# Patient Record
Sex: Male | Born: 1935 | Race: White | Hispanic: No | Marital: Single | Smoking: Never smoker
Health system: Southern US, Community
[De-identification: ages and names within clinical notes are randomized; demographics above are authoritative.]

## PROBLEM LIST (undated history)

## (undated) DIAGNOSIS — K311 Adult hypertrophic pyloric stenosis: Secondary | ICD-10-CM

## (undated) DIAGNOSIS — E079 Disorder of thyroid, unspecified: Secondary | ICD-10-CM

## (undated) DIAGNOSIS — K2270 Barrett's esophagus without dysplasia: Secondary | ICD-10-CM

## (undated) DIAGNOSIS — K219 Gastro-esophageal reflux disease without esophagitis: Secondary | ICD-10-CM

## (undated) HISTORY — PX: APPENDECTOMY: SHX54

## (undated) HISTORY — PX: OTHER SURGICAL HISTORY: SHX169

## (undated) HISTORY — PX: THYROID SURGERY: SHX805

---

## 2011-07-16 ENCOUNTER — Emergency Department (HOSPITAL_BASED_OUTPATIENT_CLINIC_OR_DEPARTMENT_OTHER): Payer: Medicare Other

## 2011-07-16 ENCOUNTER — Emergency Department (HOSPITAL_BASED_OUTPATIENT_CLINIC_OR_DEPARTMENT_OTHER)
Admission: EM | Admit: 2011-07-16 | Discharge: 2011-07-16 | Disposition: A | Discharge status: Home / Self Care | Payer: Medicare Other | Attending: Emergency Medicine | Admitting: Emergency Medicine

## 2011-07-16 DIAGNOSIS — Z79899 Other long term (current) drug therapy: Secondary | ICD-10-CM | POA: Insufficient documentation

## 2011-07-16 DIAGNOSIS — R110 Nausea: Secondary | ICD-10-CM | POA: Insufficient documentation

## 2011-07-16 DIAGNOSIS — R300 Dysuria: Secondary | ICD-10-CM | POA: Insufficient documentation

## 2011-07-16 DIAGNOSIS — N138 Other obstructive and reflux uropathy: Secondary | ICD-10-CM | POA: Insufficient documentation

## 2011-07-16 DIAGNOSIS — N23 Unspecified renal colic: Secondary | ICD-10-CM

## 2011-07-16 DIAGNOSIS — N400 Enlarged prostate without lower urinary tract symptoms: Secondary | ICD-10-CM

## 2011-07-16 DIAGNOSIS — M549 Dorsalgia, unspecified: Secondary | ICD-10-CM | POA: Insufficient documentation

## 2011-07-16 DIAGNOSIS — R1084 Generalized abdominal pain: Secondary | ICD-10-CM | POA: Insufficient documentation

## 2011-07-16 DIAGNOSIS — N401 Enlarged prostate with lower urinary tract symptoms: Secondary | ICD-10-CM | POA: Insufficient documentation

## 2011-07-16 LAB — URINALYSIS, ROUTINE W REFLEX MICROSCOPIC
Bilirubin Urine: NEGATIVE
Ketones, ur: NEGATIVE mg/dL
Nitrite: NEGATIVE
Urobilinogen, UA: 0.2 mg/dL (ref 0.0–1.0)

## 2011-07-16 LAB — CBC WITH DIFFERENTIAL/PLATELET
Basophils Relative: 0 % (ref 0–1)
Eosinophils Absolute: 0 10*3/uL (ref 0.0–0.7)
Eosinophils Relative: 0 % (ref 0–5)
MCH: 31.5 pg (ref 26.0–34.0)
MCHC: 36.3 g/dL — ABNORMAL HIGH (ref 30.0–36.0)
MCV: 86.9 fL (ref 78.0–100.0)
Monocytes Relative: 6 % (ref 3–12)
Neutrophils Relative %: 80 % — ABNORMAL HIGH (ref 43–77)
Platelets: 278 10*3/uL (ref 150–400)

## 2011-07-16 LAB — URINE MICROSCOPIC-ADD ON

## 2011-07-16 LAB — BASIC METABOLIC PANEL
BUN: 20 mg/dL (ref 6–23)
Calcium: 8.9 mg/dL (ref 8.4–10.5)
GFR calc Af Amer: 63 mL/min — ABNORMAL LOW (ref >90)
GFR calc non Af Amer: 54 mL/min — ABNORMAL LOW (ref >90)
Potassium: 4 mEq/L (ref 3.5–5.1)
Sodium: 137 mEq/L (ref 135–145)

## 2011-07-16 MED ORDER — OXYCODONE-ACETAMINOPHEN 5-325 MG PO TABS
1.0000 | ORAL_TABLET | Freq: Once | ORAL | Status: AC
  Administered 2011-07-16: 1 via ORAL
  Filled 2011-07-16: qty 1

## 2011-07-16 MED ORDER — ONDANSETRON HCL 4 MG/2ML IJ SOLN
4.0000 mg | Freq: Once | INTRAMUSCULAR | Status: AC
  Administered 2011-07-16: 4 mg via INTRAVENOUS
  Filled 2011-07-16: qty 2

## 2011-07-16 MED ORDER — HYDROCODONE-ACETAMINOPHEN 5-325 MG PO TABS: 1.0000 | ORAL_TABLET | Freq: Three times a day (TID) | ORAL | Status: AC | PRN

## 2011-07-16 MED ORDER — FENTANYL CITRATE 0.05 MG/ML IJ SOLN
50.0000 ug | Freq: Once | INTRAMUSCULAR | Status: AC
  Administered 2011-07-16: 50 ug via INTRAVENOUS
  Filled 2011-07-16: qty 2

## 2011-07-16 MED ORDER — HYDROCODONE-ACETAMINOPHEN 5-325 MG PO TABS
1.0000 | ORAL_TABLET | Freq: Once | ORAL | Status: AC
  Administered 2011-07-16: 1 via ORAL
  Filled 2011-07-16: qty 1

## 2011-07-16 MED ORDER — IBUPROFEN 400 MG PO TABS: 400.0000 mg | ORAL_TABLET | Freq: Three times a day (TID) | ORAL | Status: AC | PRN

## 2011-07-16 NOTE — ED Notes (Signed)
Pt amb to room 2 with quick steady gait in nad. Pt reports sudden onset of llq pain radiating into his penis and to his left flank area approx 2 hours ago. Pt reports history of renal stones, pt reports some hematuria two days ago.

## 2011-07-16 NOTE — ED Provider Notes (Signed)
History     CSN: [id number]  Arrival date & time 07/16/11  4098   First MD Initiated Contact with Patient 07/16/11 1000      Chief Complaint  Patient presents with  . Abdominal Pain     Patient is a 78 y.o. male presenting with abdominal pain. The history is provided by the patient and a relative.  Abdominal Pain The primary symptoms of the illness include abdominal pain, nausea and dysuria. The primary symptoms of the illness do not include fever, shortness of breath, vomiting or diarrhea. The current episode started 3 to 5 hours ago. The onset of the illness was sudden. The problem has been gradually worsening.  Additional symptoms associated with the illness include back pain. Symptoms associated with the illness do not include constipation.  pt reports onset of left flank pain that radiates to LLQ and left groin Reports distant h/o kidney stones   PMH - barrett's esophagus   No past surgical history on file.  No family history on file.  History  Substance Use Topics  . Smoking status: Not on file  . Smokeless tobacco: Not on file  . Alcohol Use: Not on file  pt denies any drug use    Review of Systems  Constitutional: Negative for fever.  Respiratory: Negative for shortness of breath.   Gastrointestinal: Positive for nausea and abdominal pain. Negative for vomiting, diarrhea and constipation.  Genitourinary: Positive for dysuria.  Musculoskeletal: Positive for back pain.  All other systems reviewed and are negative.    Allergies  Review of patient's allergies indicates no known allergies.  Home Medications   Current Outpatient Rx  Name Route Sig Dispense Refill  . FLUTICASONE FUROATE 27.5 MCG/SPRAY NA SUSP Nasal Place 2 sprays into the nose daily.    Marland Kitchen LEVOTHYROXINE SODIUM 100 MCG PO TABS Oral Take 100 mcg by mouth daily.    Marland Kitchen PANTOPRAZOLE SODIUM 40 MG PO TBEC Oral Take 40 mg by mouth daily.    Marland Kitchen ZOLPIDEM TARTRATE ER 12.5 MG PO TBCR Oral Take 12.5 mg by  mouth at bedtime as needed.      BP 173/76  Pulse 60  Temp 97.9 F (36.6 C) (Oral)  Resp 18  Ht 5\' 6"  (1.676 m)  Wt 146 lb (66.225 kg)  BMI 23.56 kg/m2  SpO2 98%  Physical Exam CONSTITUTIONAL: Well developed/well nourished HEAD AND FACE: Normocephalic/atraumatic EYES: EOMI/PERRL ENMT: Mucous membranes moist NECK: supple no meningeal signs SPINE:entire spine nontender CV: S1/S2 noted, no murmurs/rubs/gallops noted LUNGS: Lungs are clear to auscultation bilaterally, no apparent distress ABDOMEN: soft, nontender, no rebound or guarding JX:BJYN cva tenderness, no hernia, no testicular tenderness, chaperone present NEURO: Pt is awake/alert, moves all extremitiesx4, no focal leg weakness noted EXTREMITIES: pulses normal, full ROM SKIN: warm, color normal PSYCH: no abnormalities of mood noted  ED Course  Procedures  Labs Reviewed  CBC WITH DIFFERENTIAL - Abnormal; Notable for the following:    MCHC 36.3 (*)     Neutrophils Relative 80 (*)     All other components within normal limits  BASIC METABOLIC PANEL - Abnormal; Notable for the following:    Glucose, Bld 159 (*)     GFR calc non Af Amer 54 (*)     GFR calc Af Amer 63 (*)     All other components within normal limits  URINALYSIS, ROUTINE W REFLEX MICROSCOPIC   10:46 AM Pt reports distant h/o "gravel" in his kidney, at this time history c/w ureteral colic but  given age, will obtain CT imaging   2:20 PM Pt with some improvement in pain Reports his urologist is in [place].  He request ct imaging to take to him.  Advised of enlarged prostate on CT scan  3:17 PM Pt ambulatory, feels improved, feels safe for d/c.  Did not tolerate percocet, he was able tolerate small dose of vicodin, gave vicodin at d/c and also short course of ibuprofen.   MDM  Nursing notes including past medical history and social history reviewed and considered in documentation All labs/vitals reviewed and considered         Joya Gaskins, MD 07/16/11 [phone]

## 2011-07-16 NOTE — ED Notes (Signed)
Pt ambulatory with standby assistance 

## 2012-06-01 ENCOUNTER — Encounter (HOSPITAL_BASED_OUTPATIENT_CLINIC_OR_DEPARTMENT_OTHER): Payer: Self-pay

## 2012-06-01 ENCOUNTER — Emergency Department (HOSPITAL_BASED_OUTPATIENT_CLINIC_OR_DEPARTMENT_OTHER)
Admission: EM | Admit: 2012-06-01 | Discharge: 2012-06-01 | Disposition: A | Discharge status: Home / Self Care | Payer: Medicare Other | Attending: Emergency Medicine | Admitting: Emergency Medicine

## 2012-06-01 DIAGNOSIS — L509 Urticaria, unspecified: Secondary | ICD-10-CM | POA: Diagnosis present

## 2012-06-01 DIAGNOSIS — K2270 Barrett's esophagus without dysplasia: Secondary | ICD-10-CM | POA: Insufficient documentation

## 2012-06-01 DIAGNOSIS — K219 Gastro-esophageal reflux disease without esophagitis: Secondary | ICD-10-CM | POA: Insufficient documentation

## 2012-06-01 DIAGNOSIS — J309 Allergic rhinitis, unspecified: Secondary | ICD-10-CM | POA: Insufficient documentation

## 2012-06-01 DIAGNOSIS — Z8719 Personal history of other diseases of the digestive system: Secondary | ICD-10-CM | POA: Insufficient documentation

## 2012-06-01 DIAGNOSIS — R21 Rash and other nonspecific skin eruption: Secondary | ICD-10-CM | POA: Insufficient documentation

## 2012-06-01 DIAGNOSIS — R1310 Dysphagia, unspecified: Secondary | ICD-10-CM | POA: Insufficient documentation

## 2012-06-01 DIAGNOSIS — E079 Disorder of thyroid, unspecified: Secondary | ICD-10-CM | POA: Insufficient documentation

## 2012-06-01 DIAGNOSIS — Z79899 Other long term (current) drug therapy: Secondary | ICD-10-CM | POA: Insufficient documentation

## 2012-06-01 DIAGNOSIS — L299 Pruritus, unspecified: Secondary | ICD-10-CM | POA: Insufficient documentation

## 2012-06-01 HISTORY — DX: Disorder of thyroid, unspecified: E07.9

## 2012-06-01 HISTORY — DX: Gastro-esophageal reflux disease without esophagitis: K21.9

## 2012-06-01 HISTORY — DX: Barrett's esophagus without dysplasia: K22.70

## 2012-06-01 HISTORY — DX: Adult hypertrophic pyloric stenosis: K31.1

## 2012-06-01 MED ORDER — ESOMEPRAZOLE MAGNESIUM 40 MG PO CPDR: 40.0000 mg | DELAYED_RELEASE_CAPSULE | Freq: Every day | ORAL | Status: DC

## 2012-06-01 MED ORDER — LORATADINE 10 MG PO TABS: 10.0000 mg | ORAL_TABLET | Freq: Every day | ORAL | Status: DC

## 2012-06-01 MED ORDER — LORATADINE 10 MG PO TABS
ORAL_TABLET | ORAL | Status: AC
  Filled 2012-06-01: qty 1

## 2012-06-01 MED ORDER — LORATADINE 10 MG PO TABS
10.0000 mg | ORAL_TABLET | Freq: Every day | ORAL | Status: DC
  Administered 2012-06-01: 08:00:00 via ORAL

## 2012-06-01 MED ORDER — FAMOTIDINE 40 MG PO TABS: 40.0000 mg | ORAL_TABLET | Freq: Two times a day (BID) | ORAL | Status: DC

## 2012-06-01 MED ORDER — FAMOTIDINE 20 MG PO TABS
40.0000 mg | ORAL_TABLET | Freq: Once | ORAL | Status: AC
  Administered 2012-06-01: 40 mg via ORAL
  Filled 2012-06-01 (×2): qty 1

## 2012-06-01 NOTE — ED Provider Notes (Signed)
History    CSN: [id number] Arrival date & time 06/01/12  0712  None    Chief Complaint  Patient presents with  . Urticaria   Patient is a 79 y.o. male presenting with allergic reaction. The history is provided by the patient.  Allergic Reaction Presenting symptoms: difficulty swallowing (Chronic and is unchanged.  Reports esophageal stricture), itching and rash   Presenting symptoms: no difficulty breathing, no swelling and no wheezing   Itching:    Location:  Full body   Severity:  Moderate   Onset quality:  Gradual   Duration:  1 day   Timing:  Constant   Progression:  Worsening Severity:  Severe Prior allergic episodes:  No prior episodes Context: medications (just pickup a refill on omeprazole.  2 were taken yesterday and reaction occur approximately our after taken second pill)   Context: no animal exposure, no chemicals, no cosmetics, no new detergents/soaps and no poison ivy   Relieved by:  None tried Worsened by:  Nothing tried Ineffective treatments:  None tried  Past Medical History  Diagnosis Date  . Thyroid disease   . Barretts esophagus   . GERD (gastroesophageal reflux disease)   . Pyloric stenosis     Past Surgical History  Procedure Laterality Date  . Thyroid surgery    . Appendectomy      Ruptured, opened  . Pyloric stenosis surgery      At age 66 months    No family history on file.  History  Substance Use Topics  . Smoking status: Never Smoker   . Smokeless tobacco: Not on file  . Alcohol Use: No    Review of Systems  Constitutional: Negative for fever, chills, diaphoresis, activity change, appetite change and fatigue.  HENT: Positive for trouble swallowing (Chronic and is unchanged.  Reports esophageal stricture). Negative for congestion, facial swelling, rhinorrhea, neck pain and voice change.   Eyes: Negative.   Respiratory: Negative for cough, chest tightness, shortness of breath and wheezing.   Cardiovascular: Negative for chest pain  and palpitations.  Genitourinary: Negative for difficulty urinating.  Skin: Positive for itching and rash.  Allergic/Immunologic: Positive for environmental allergies and food allergies (No new food exposure). Negative for immunocompromised state.  Neurological: Negative.  Negative for dizziness and headaches.  Hematological: Negative.  Negative for adenopathy.  Psychiatric/Behavioral: Negative.     Allergies  Shellfish allergy  Home Medications   Current Outpatient Rx  Name  Route  Sig  Dispense  Refill  . esomeprazole (NEXIUM) 40 MG capsule   Oral   Take 1 capsule (40 mg total) by mouth daily before breakfast.   30 capsule   0   . famotidine (PEPCID) 40 MG tablet   Oral   Take 1 tablet (40 mg total) by mouth 2 (two) times daily.   14 tablet   1   . fluticasone (VERAMYST) 27.5 MCG/SPRAY nasal spray   Nasal   Place 2 sprays into the nose daily.         Marland Kitchen levothyroxine (SYNTHROID, LEVOTHROID) 100 MCG tablet   Oral   Take 100 mcg by mouth daily.         Marland Kitchen loratadine (CLARITIN) 10 MG tablet   Oral   Take 1 tablet (10 mg total) by mouth daily.   30 tablet   0   . zolpidem (AMBIEN CR) 12.5 MG CR tablet   Oral   Take 12.5 mg by mouth at bedtime as needed.  BP 131/84  Pulse 78  Temp(Src) 97.8 F (36.6 C) (Oral)  Resp 18  Ht 5' 6.5" (1.689 m)  Wt 146 lb (66.225 kg)  BMI 23.21 kg/m2  SpO2 96%  Physical Exam  Nursing note and vitals reviewed. Constitutional: He appears well-developed and well-nourished. No distress.  HENT:  Head: Normocephalic and atraumatic.  Mouth/Throat: No oral lesions. No edematous. No oropharyngeal exudate, posterior oropharyngeal edema or posterior oropharyngeal erythema.  Eyes: Conjunctivae are normal. Right eye exhibits no discharge. Left eye exhibits no discharge. No scleral icterus.  Neck: No JVD present. No tracheal tenderness present. Carotid bruit is not present. No rigidity. No tracheal deviation and no edema present.   Cardiovascular: Normal rate, regular rhythm, normal heart sounds and intact distal pulses.  Exam reveals no gallop and no friction rub.   No murmur heard. Pulmonary/Chest: Effort normal and breath sounds normal. No respiratory distress. He has no wheezes. He has no rales.  Abdominal: Soft.  Musculoskeletal: Normal range of motion. He exhibits no edema.  Neurological: He is alert. He exhibits normal muscle tone.  Skin: Skin is warm and dry. Rash noted. Rash is urticarial. He is not diaphoretic. No erythema. No pallor.     Psychiatric: He has a normal mood and affect. His behavior is normal. Judgment and thought content normal.    ED Course  Procedures (including critical care time)  Labs Reviewed - No data to display No results found.   1. Urticaria     MDM  Patient with classic urticarial reaction.  No evidence of airway involvement.  Unclear if idiopathic versus reaction to omeprazole that he received a new refill on yesterday.  Given his age and [place] list consideration will treat with Zyrtec and Pepcid avoiding 1st gen H1 blockers.    New prescription for esomeprazole.  Recognize Pepcid and esomeprazole as duplicate therapy; the Pepcid will be short-term only to help with reaction.   Andrena Mews, DO 06/01/12 [phone]

## 2012-06-01 NOTE — ED Provider Notes (Signed)
I saw and evaluated the patient, reviewed the resident's note and I agree with the findings and plan.  On my exam this pleasant male was in no distress.  No e/o respiratory distress. We discussed possible precipitants and return precautions.  He was d/c in stable condition.  Gerhard Munch, MD 06/01/12 [phone]

## 2012-06-01 NOTE — ED Notes (Signed)
MD at bedside. 

## 2012-06-01 NOTE — ED Notes (Addendum)
Pt reports hives on arms and legs that started yesterday after taking a second dose of Protonix.

## 2013-07-28 ENCOUNTER — Encounter (HOSPITAL_BASED_OUTPATIENT_CLINIC_OR_DEPARTMENT_OTHER): Payer: Self-pay | Admitting: Emergency Medicine

## 2013-07-28 ENCOUNTER — Emergency Department (HOSPITAL_BASED_OUTPATIENT_CLINIC_OR_DEPARTMENT_OTHER)
Admission: EM | Admit: 2013-07-28 | Discharge: 2013-07-28 | Disposition: A | Discharge status: Home / Self Care | Payer: Medicare Other | Attending: Emergency Medicine | Admitting: Emergency Medicine

## 2013-07-28 DIAGNOSIS — E079 Disorder of thyroid, unspecified: Secondary | ICD-10-CM | POA: Diagnosis not present

## 2013-07-28 DIAGNOSIS — R21 Rash and other nonspecific skin eruption: Secondary | ICD-10-CM | POA: Insufficient documentation

## 2013-07-28 DIAGNOSIS — K219 Gastro-esophageal reflux disease without esophagitis: Secondary | ICD-10-CM | POA: Insufficient documentation

## 2013-07-28 DIAGNOSIS — IMO0002 Reserved for concepts with insufficient information to code with codable children: Secondary | ICD-10-CM | POA: Diagnosis not present

## 2013-07-28 DIAGNOSIS — Z79899 Other long term (current) drug therapy: Secondary | ICD-10-CM | POA: Diagnosis not present

## 2013-07-28 MED ORDER — PREDNISONE 20 MG PO TABS: ORAL_TABLET | ORAL | Status: DC

## 2013-07-28 NOTE — ED Provider Notes (Signed)
CSN: [id number]     Arrival date & time 07/28/13  [id number] History   First MD Initiated Contact with Patient 07/28/13 [phone]     Chief Complaint  Patient presents with  . Allergic Reaction     (Consider location/radiation/quality/duration/timing/severity/associated sxs/prior Treatment) HPI Comments: 80 year old male with reflux and thyroid disease presents with intermittent gradually worsening rash in the axilla and groin bilateral, pruritic.  Patient had similar episode in April that went away he is unsure what medicine he was given. Patient not aware of any new medicines, soaps, detergents or other new exposures, no hiking recently. He lives in retirement living and unsure of any new cleaners use of the facility. No fevers chills or systemic symptoms.  Patient is a 80 y.o. male presenting with allergic reaction. The history is provided by the patient.  Allergic Reaction Presenting symptoms: rash     Past Medical History  Diagnosis Date  . Thyroid disease   . Barretts esophagus   . GERD (gastroesophageal reflux disease)   . Pyloric stenosis    Past Surgical History  Procedure Laterality Date  . Thyroid surgery    . Appendectomy      Ruptured, opened  . Pyloric stenosis surgery      At age 653 months   No family history on file. History  Substance Use Topics  . Smoking status: Never Smoker   . Smokeless tobacco: Not on file  . Alcohol Use: No    Review of Systems  Constitutional: Negative for fever and chills.  Respiratory: Negative for shortness of breath.   Skin: Positive for rash.  Neurological: Negative for light-headedness.      Allergies  Shellfish allergy  Home Medications   Prior to Admission medications   Medication Sig Start Date End Date Taking? Authorizing Provider  esomeprazole (NEXIUM) 40 MG capsule Take 1 capsule (40 mg total) by mouth daily before breakfast. 06/01/12   Andrena MewsMichael D Rigby, DO  famotidine (PEPCID) 40 MG tablet Take 1 tablet (40 mg total) by  mouth 2 (two) times daily. 06/01/12   Andrena MewsMichael D Rigby, DO  fluticasone (VERAMYST) 27.5 MCG/SPRAY nasal spray Place 2 sprays into the nose daily.    Historical Provider, MD  levothyroxine (SYNTHROID, LEVOTHROID) 100 MCG tablet Take 100 mcg by mouth daily.    Historical Provider, MD  loratadine (CLARITIN) 10 MG tablet Take 1 tablet (10 mg total) by mouth daily. 06/01/12   Andrena MewsMichael D Rigby, DO  zolpidem (AMBIEN CR) 12.5 MG CR tablet Take 12.5 mg by mouth at bedtime as needed.    Historical Provider, MD   BP 148/69  Pulse 83  Temp(Src) 98 F (36.7 C) (Oral)  Resp 14  Ht 5' 6.5" (1.689 m)  Wt 148 lb (67.132 kg)  BMI 23.53 kg/m2  SpO2 100% Physical Exam  Nursing note and vitals reviewed. Constitutional: He appears well-developed and well-nourished. No distress.  HENT:  Head: Normocephalic and atraumatic.  Eyes: Right eye exhibits no discharge. Left eye exhibits no discharge.  Cardiovascular: Normal rate.   Pulmonary/Chest: Effort normal. No respiratory distress. He has no wheezes.  Neurological: He is alert.  Skin: Rash noted.  Patient has scattered hives and upper flank/axilla bilateral and lateral groin line without discharge or sign of infection.    ED Course  Procedures (including critical care time) Labs Review Labs Reviewed - No data to display  Imaging Review No results found.   EKG Interpretation None      MDM   Final diagnoses:  Rash and nonspecific skin eruption   With location of rash discussed possibility of related to detergent as increase contact with close in that area. Patient will try to switch detergent to something more pure/nonscented etc. if no improvement he will try a short course of prednisone followup with his doctor.  Results and differential diagnosis were discussed with the patient/parent/guardian. Close follow up outpatient was discussed, comfortable with the plan.   Medications - No data to display  Filed Vitals:   07/28/13 0724  BP: 148/69   Pulse: 83  Temp: 98 F (36.7 C)  TempSrc: Oral  Resp: 14  Height: 5' 6.5" (1.689 m)  Weight: 148 lb (67.132 kg)  SpO2: 100%        Enid Skeens, MD 07/28/13 [phone]

## 2013-07-28 NOTE — Discharge Instructions (Signed)
Try to switch her detergents and soaps in your home. This does not help or your symptoms worsen try short course of prednisone and follow up at your primary care Office or dermatology. Return to the ER if you develop breathing difficulty, tongues swelling, throat swelling or new or worsening concerns.  If you were given medicines take as directed.  If you are on coumadin or contraceptives realize their levels and effectiveness is altered by many different medicines.  If you have any reaction (rash, tongues swelling, other) to the medicines stop taking and see a physician.   Please follow up as directed and return to the ER or see a physician for new or worsening symptoms.  Thank you. Filed Vitals:   07/28/13 0724  BP: 148/69  Pulse: 83  Temp: 98 F (36.7 C)  TempSrc: Oral  Resp: 14  Height: 5' 6.5" (1.689 m)  Weight: 148 lb (67.132 kg)  SpO2: 100%

## 2013-07-28 NOTE — ED Notes (Signed)
Onset of sx  Tuesday. Whelps over arms, chest, and back scattered. C/p itching. States it happened in April and went away. No changes is soap and detergents. No resp issues.

## 2014-06-18 ENCOUNTER — Emergency Department (HOSPITAL_BASED_OUTPATIENT_CLINIC_OR_DEPARTMENT_OTHER)
Admission: EM | Admit: 2014-06-18 | Discharge: 2014-06-18 | Disposition: A | Discharge status: Home / Self Care | Payer: Medicare Other | Attending: Emergency Medicine | Admitting: Emergency Medicine

## 2014-06-18 DIAGNOSIS — J019 Acute sinusitis, unspecified: Secondary | ICD-10-CM | POA: Diagnosis not present

## 2014-06-18 DIAGNOSIS — Z79899 Other long term (current) drug therapy: Secondary | ICD-10-CM | POA: Insufficient documentation

## 2014-06-18 DIAGNOSIS — Z8719 Personal history of other diseases of the digestive system: Secondary | ICD-10-CM | POA: Insufficient documentation

## 2014-06-18 DIAGNOSIS — E039 Hypothyroidism, unspecified: Secondary | ICD-10-CM | POA: Insufficient documentation

## 2014-06-18 DIAGNOSIS — R51 Headache: Secondary | ICD-10-CM | POA: Diagnosis present

## 2014-06-18 MED ORDER — AMOXICILLIN-POT CLAVULANATE 875-125 MG PO TABS: 1.0000 | ORAL_TABLET | Freq: Two times a day (BID) | ORAL | Status: DC

## 2014-06-18 MED ORDER — LORATADINE 10 MG PO TABS: 10.0000 mg | ORAL_TABLET | Freq: Every day | ORAL | Status: DC

## 2014-06-18 MED ORDER — SALINE SPRAY 0.65 % NA SOLN: 1.0000 | NASAL | Status: AC | PRN

## 2014-06-18 NOTE — ED Provider Notes (Signed)
CSN: [id number]     Arrival date & time 06/18/14  0950 History   First MD Initiated Contact with Patient 06/18/14 1000     Chief Complaint  Patient presents with  . Head congestion      (Consider location/radiation/quality/duration/timing/severity/associated sxs/prior Treatment) HPI Comments: Patient is a 81 year old male past medical history significant for thyroid disease, Barrett's esophagus, GERD presenting to the ED for one month of sinus pressure and congestion that has been worsening. He states it feels very full behind his eyes and he has tried nasal spray with little to no improvement over the last 5 days. He does endorse one episode of feeling lightheaded 5 days ago, states he did not drink as much water that day as he should've. Denies any further recurrence. No modifying factors identified. Denies any headache, shortness of breath, chest pain, syncope, nausea, vomiting, abdominal pain.   Past Medical History  Diagnosis Date  . Thyroid disease   . Barretts esophagus   . GERD (gastroesophageal reflux disease)   . Pyloric stenosis    Past Surgical History  Procedure Laterality Date  . Thyroid surgery    . Appendectomy      Ruptured, opened  . Pyloric stenosis surgery      At age 30 months   No family history on file. History  Substance Use Topics  . Smoking status: Never Smoker   . Smokeless tobacco: Not on file  . Alcohol Use: No    Review of Systems  HENT: Positive for congestion and sinus pressure. Negative for facial swelling and nosebleeds.   All other systems reviewed and are negative.     Allergies  Shellfish allergy  Home Medications   Prior to Admission medications   Medication Sig Start Date End Date Taking? Authorizing Provider  amoxicillin-clavulanate (AUGMENTIN) 875-125 MG per tablet Take 1 tablet by mouth every 12 (twelve) hours. 06/18/14   Jozy Mcphearson, PA-C  famotidine (PEPCID) 40 MG tablet Take 1 tablet (40 mg total) by mouth 2 (two)  times daily. 06/01/12   Andrena Mews, DO  lanolin ointment Apply topically as needed for dry skin.    Historical Provider, MD  levothyroxine (SYNTHROID, LEVOTHROID) 100 MCG tablet Take 100 mcg by mouth daily.    Historical Provider, MD  loratadine (CLARITIN) 10 MG tablet Take 1 tablet (10 mg total) by mouth daily. 06/18/14   Amaia Lavallie, PA-C  sodium chloride (OCEAN) 0.65 % SOLN nasal spray Place 1 spray into both nostrils as needed for congestion. 06/18/14   Virga Haltiwanger, PA-C  timolol (TIMOPTIC-XR) 0.5 % ophthalmic gel-forming 1 drop daily.    Historical Provider, MD  zolpidem (AMBIEN CR) 12.5 MG CR tablet Take 12.5 mg by mouth at bedtime as needed.    Historical Provider, MD   BP 127/76 mmHg  Pulse 84  Temp(Src) 97.8 F (36.6 C) (Oral)  Resp 18  Ht 5' 6.5" (1.689 m)  Wt 149 lb (67.586 kg)  BMI 23.69 kg/m2  SpO2 98% Physical Exam  Constitutional: He is oriented to person, place, and time. He appears well-developed and well-nourished. No distress.  HENT:  Head: Normocephalic and atraumatic.  Right Ear: Hearing, tympanic membrane, external ear and ear canal normal.  Left Ear: Hearing, tympanic membrane, external ear and ear canal normal.  Nose: Right sinus exhibits maxillary sinus tenderness and frontal sinus tenderness. Left sinus exhibits maxillary sinus tenderness and frontal sinus tenderness.  Mouth/Throat: Uvula is midline, oropharynx is clear and moist and mucous membranes are normal. No  oropharyngeal exudate.  Eyes: Conjunctivae and EOM are normal. Pupils are equal, round, and reactive to light.  Neck: Normal range of motion. Neck supple.  No nuchal rigidity.   Cardiovascular: Normal rate, regular rhythm, normal heart sounds and intact distal pulses.   Pulmonary/Chest: Effort normal and breath sounds normal. No respiratory distress.  Abdominal: Soft. There is no tenderness.  Musculoskeletal: Normal range of motion.  Neurological: He is alert and oriented to  person, place, and time. He has normal strength. No cranial nerve deficit. He displays a negative Romberg sign. Gait normal. GCS eye subscore is 4. GCS verbal subscore is 5. GCS motor subscore is 6.  Sensation grossly intact.  No pronator drift.  Bilateral heel-knee-shin intact.  Skin: Skin is warm and dry. He is not diaphoretic.  Psychiatric: He has a normal mood and affect.  Nursing note and vitals reviewed.   ED Course  Procedures (including critical care time) Medications - No data to display  Labs Review Labs Reviewed - No data to display  Imaging Review No results found.   EKG Interpretation None      MDM   Final diagnoses:  Acute sinusitis, recurrence not specified, unspecified location    Filed Vitals:   06/18/14 0959  BP: 127/76  Pulse: 84  Temp: 97.8 F (36.6 C)  Resp: 18   Afebrile, NAD, non-toxic appearing, AAOx4. No neurofocal deficits. No red flags on history or physical. Patient complaining of symptoms of sinusitis.    Severe symptoms have been present for greater than 10 days with purulent nasal discharge and maxillary sinus pain.  Concern for acute bacterial rhinosinusitis.  Patient discharged with Augmentin.  Instructions given for warm saline nasal wash and recommendations for follow-up with primary care physician.  Patient is stable at time of discharge. Patient d/w with Dr. Micheline Mazeocherty, agrees with plan.      Francee PiccoloJennifer Emerald Shor, PA-C 06/18/14 1046  Toy CookeyMegan Docherty, MD 06/18/14 [phone]

## 2014-06-18 NOTE — ED Notes (Signed)
Dizziness that started 5 days ago and lasted only that one episode.  Also has head congestion x 1 month.  He tells me he hasn't been drinking as much as he should and may be dehydrated.

## 2014-06-18 NOTE — Discharge Instructions (Signed)
Please follow up with your primary care physician in 1-2 days. If you do not have one please call the [facility] number listed above. Please take your antibiotic until completion. Please read all discharge instructions and return precautions.  ° ° °Sinusitis °Sinusitis is redness, soreness, and inflammation of the paranasal sinuses. Paranasal sinuses are air pockets within the bones of your face (beneath the eyes, the middle of the forehead, or above the eyes). In healthy paranasal sinuses, mucus is able to drain out, and air is able to circulate through them by way of your nose. However, when your paranasal sinuses are inflamed, mucus and air can become trapped. This can allow bacteria and other germs to grow and cause infection. °Sinusitis can develop quickly and last only a short time (acute) or continue over a long period (chronic). Sinusitis that lasts for more than 12 weeks is considered chronic.  °CAUSES  °Causes of sinusitis include: °· Allergies. °· Structural abnormalities, such as displacement of the cartilage that separates your nostrils (deviated septum), which can decrease the air flow through your nose and sinuses and affect sinus drainage. °· Functional abnormalities, such as when the small hairs (cilia) that line your sinuses and help remove mucus do not work properly or are not present. °SIGNS AND SYMPTOMS  °Symptoms of acute and chronic sinusitis are the same. The primary symptoms are pain and pressure around the affected sinuses. Other symptoms include: °· Upper toothache. °· Earache. °· Headache. °· Bad breath. °· Decreased sense of smell and taste. °· A cough, which worsens when you are lying flat. °· Fatigue. °· Fever. °· Thick drainage from your nose, which often is green and may contain pus (purulent). °· Swelling and warmth over the affected sinuses. °DIAGNOSIS  °Your health care provider will perform a physical exam. During the exam, your health care provider  may: °· Look in your nose for signs of abnormal growths in your nostrils (nasal polyps). °· Tap over the affected sinus to check for signs of infection. °· View the inside of your sinuses (endoscopy) using an imaging device that has a light attached (endoscope). °If your health care provider suspects that you have chronic sinusitis, one or more of the following tests may be recommended: °· Allergy tests. °· Nasal culture. A sample of mucus is taken from your nose, sent to a lab, and screened for bacteria. °· Nasal cytology. A sample of mucus is taken from your nose and examined by your health care provider to determine if your sinusitis is related to an allergy. °TREATMENT  °Most cases of acute sinusitis are related to a viral infection and will resolve on their own within 10 days. Sometimes medicines are prescribed to help relieve symptoms (pain medicine, decongestants, nasal steroid sprays, or saline sprays).  °However, for sinusitis related to a bacterial infection, your health care provider will prescribe antibiotic medicines. These are medicines that will help kill the bacteria causing the infection.  °Rarely, sinusitis is caused by a fungal infection. In theses cases, your health care provider will prescribe antifungal medicine. °For some cases of chronic sinusitis, surgery is needed. Generally, these are cases in which sinusitis recurs more than 3 times per year, despite other treatments. °HOME CARE INSTRUCTIONS  °· Drink plenty of water. Water helps thin the mucus so your sinuses can drain more easily. °· Use a humidifier. °· Inhale steam 3 to 4 times a day (for example, sit in the bathroom with the shower running). °· Apply   a warm, moist washcloth to your face 3 to 4 times a day, or as directed by your health care provider. °· Use saline nasal sprays to help moisten and clean your sinuses. °· Take medicines only as directed by your health care provider. °· If you were prescribed either an antibiotic or  antifungal medicine, finish it all even if you start to feel better. °SEEK IMMEDIATE MEDICAL CARE IF: °· You have increasing pain or severe headaches. °· You have nausea, vomiting, or drowsiness. °· You have swelling around your face. °· You have vision problems. °· You have a stiff neck. °· You have difficulty breathing. °MAKE SURE YOU:  °· Understand these instructions. °· Will watch your condition. °· Will get help right away if you are not doing well or get worse. °Document Released: 12/29/2004 Document Revised: 05/15/2013 Document Reviewed: 01/13/2011 °[employer]® Patient Information ©2015 [employer]. This information is not intended to replace advice given to you by your health care provider. Make sure you discuss any questions you have with your health care provider. ° ° ° °

## 2014-11-13 ENCOUNTER — Encounter (HOSPITAL_BASED_OUTPATIENT_CLINIC_OR_DEPARTMENT_OTHER): Payer: Self-pay

## 2014-11-13 ENCOUNTER — Emergency Department (HOSPITAL_BASED_OUTPATIENT_CLINIC_OR_DEPARTMENT_OTHER)
Admission: EM | Admit: 2014-11-13 | Discharge: 2014-11-13 | Disposition: A | Discharge status: Home / Self Care | Payer: Medicare Other | Attending: Emergency Medicine | Admitting: Emergency Medicine

## 2014-11-13 DIAGNOSIS — M5442 Lumbago with sciatica, left side: Secondary | ICD-10-CM

## 2014-11-13 DIAGNOSIS — E079 Disorder of thyroid, unspecified: Secondary | ICD-10-CM | POA: Diagnosis not present

## 2014-11-13 DIAGNOSIS — Z8719 Personal history of other diseases of the digestive system: Secondary | ICD-10-CM | POA: Insufficient documentation

## 2014-11-13 DIAGNOSIS — Z79899 Other long term (current) drug therapy: Secondary | ICD-10-CM | POA: Insufficient documentation

## 2014-11-13 DIAGNOSIS — M549 Dorsalgia, unspecified: Secondary | ICD-10-CM | POA: Diagnosis present

## 2014-11-13 LAB — URINALYSIS, ROUTINE W REFLEX MICROSCOPIC
Bilirubin Urine: NEGATIVE
Glucose, UA: NEGATIVE mg/dL
Ketones, ur: NEGATIVE mg/dL
LEUKOCYTES UA: NEGATIVE
Nitrite: NEGATIVE
PH: 6.5 (ref 5.0–8.0)
Protein, ur: NEGATIVE mg/dL
SPECIFIC GRAVITY, URINE: 1.007 (ref 1.005–1.030)
UROBILINOGEN UA: 0.2 mg/dL (ref 0.0–1.0)

## 2014-11-13 LAB — URINE MICROSCOPIC-ADD ON

## 2014-11-13 NOTE — ED Notes (Signed)
[place]pain to left flank at that time-over the past 2 weeks pain to left buttock and lower back-pt NAD-steady gait

## 2014-11-13 NOTE — ED Provider Notes (Signed)
CSN: [id number]     Arrival date & time 11/13/14  1415 History   First MD Initiated Contact with Patient 11/13/14 1629     Chief Complaint  Patient presents with  . Back Pain     (Consider location/radiation/quality/duration/timing/severity/associated sxs/prior Treatment) Patient is a 81 y.o. male presenting with back pain.  Back Pain Location:  Sacro-iliac joint Quality:  Aching and stabbing Radiates to: L buttock, L hip. Pain severity:  Moderate Pain is:  Same all the time Onset quality:  Gradual Duration:  2 weeks Timing:  Constant Progression:  Waxing and waning Chronicity:  New Context: falling (2 months ago)   Relieved by:  Lying down Worsened by:  Movement and sitting Associated symptoms: no abdominal pain, no bladder incontinence, no fever, no numbness, no paresthesias, no perianal numbness and no tingling     Past Medical History  Diagnosis Date  . Thyroid disease   . Barretts esophagus   . GERD (gastroesophageal reflux disease)   . Pyloric stenosis    Past Surgical History  Procedure Laterality Date  . Thyroid surgery    . Appendectomy      Ruptured, opened  . Pyloric stenosis surgery      At age 33 months   No family history on file. Social History  Substance Use Topics  . Smoking status: Never Smoker   . Smokeless tobacco: None  . Alcohol Use: Yes     Comment: occ    Review of Systems  Constitutional: Negative for fever.  Gastrointestinal: Negative for abdominal pain.  Genitourinary: Negative for bladder incontinence.  Musculoskeletal: Positive for back pain.  Neurological: Negative for tingling, numbness and paresthesias.  All other systems reviewed and are negative.     Allergies  Shellfish allergy  Home Medications   Prior to Admission medications   Medication Sig Start Date End Date Taking? Authorizing Provider  FINASTERIDE PO Take by mouth.   Yes Historical Provider, MD  levothyroxine (SYNTHROID, LEVOTHROID) 100 MCG tablet Take  100 mcg by mouth daily.    Historical Provider, MD  sodium chloride (OCEAN) 0.65 % SOLN nasal spray Place 1 spray into both nostrils as needed for congestion. 06/18/14   Jennifer Piepenbrink, PA-C  timolol (TIMOPTIC-XR) 0.5 % ophthalmic gel-forming 1 drop daily.    Historical Provider, MD  zolpidem (AMBIEN CR) 12.5 MG CR tablet Take 12.5 mg by mouth at bedtime as needed.    Historical Provider, MD   BP 154/79 mmHg  Pulse 99  Temp(Src) 98.8 F (37.1 C) (Oral)  Resp 18  Ht  (1.676 m)  Wt 148 lb (67.132 kg)  BMI 23.90 kg/m2  SpO2 94% Physical Exam  Constitutional: He is oriented to person, place, and time. He appears well-developed and well-nourished.  HENT:  Head: Normocephalic and atraumatic.  Eyes: Conjunctivae and EOM are normal.  Neck: Normal range of motion. Neck supple.  Cardiovascular: Normal rate, regular rhythm and normal heart sounds.   Pulmonary/Chest: Effort normal and breath sounds normal. No respiratory distress.  Abdominal: He exhibits no distension. There is no tenderness. There is no rebound and no guarding.  Musculoskeletal: Normal range of motion.       Lumbar back: He exhibits tenderness. He exhibits no bony tenderness.  Straight leg negative, no neuro deficits of legs  Neurological: He is alert and oriented to person, place, and time.  Skin: Skin is warm and dry.  Vitals reviewed.   ED Course  Procedures (including critical care time) Labs Review Labs Reviewed  URINALYSIS, ROUTINE W REFLEX MICROSCOPIC (NOT AT [facility]) - Abnormal; Notable for the following:    Hgb urine dipstick TRACE (*)    All other components within normal limits  URINE MICROSCOPIC-ADD ON    Imaging Review No results found. I have personally reviewed and evaluated these images and lab results as part of my medical decision-making.   EKG Interpretation None     EMERGENCY DEPARTMENT ULTRASOUND  Study: Limited Retroperitoneal Ultrasound of the Abdominal Aorta.  INDICATIONS:Back  pain and Age>55 Multiple views of the abdominal aorta were obtained in real-time from the diaphragmatic hiatus to the aortic bifurcation in transverse planes with a multi-frequency probe. PERFORMED BY: Myself IMAGES ARCHIVED?: Yes FINDINGS: Maximum aortic dimensions are 1.96 cm LIMITATIONS:  Bowel gas INTERPRETATION:  No abdominal aortic aneurysm and Abdominal free fluid absent   CPT Code: 16109-6076775-26 (limited retroperitoneal)   MDM   Final diagnoses:  Left-sided low back pain with left-sided sciatica    81 y.o. male with pertinent PMH of barretts esophagus presents with mild back pain as above.  Initially fell 2 months ago, symptoms improved, then recurred 2 weeks ago.  Physical exam on arrival as above.  UA unremarkable with exception of trace hgb, no RBC on micro.  History and exam not consistent with nephro or pyelonephritis.  Likely sciatica.  DC home in stable condition  I have reviewed all laboratory and imaging studies if ordered as above  1. Left-sided low back pain with left-sided sciatica         Mirian MoMatthew Jo-Ann Johanning, MD 11/13/14 1821

## 2014-11-13 NOTE — Discharge Instructions (Signed)

## 2014-11-13 NOTE — ED Notes (Signed)
Nurse first-pt ambulated to reg desk with steady gait and NAD-angrily questioning wait time-advised ED extremely busy today and he would be seen asap-pt ambulated back to seat w/o difficulty

## 2014-11-13 NOTE — ED Notes (Signed)
MD at bedside. 

## 2015-11-27 ENCOUNTER — Encounter (HOSPITAL_BASED_OUTPATIENT_CLINIC_OR_DEPARTMENT_OTHER): Payer: Self-pay

## 2015-11-27 ENCOUNTER — Emergency Department (HOSPITAL_BASED_OUTPATIENT_CLINIC_OR_DEPARTMENT_OTHER)
Admission: EM | Admit: 2015-11-27 | Discharge: 2015-11-27 | Disposition: A | Discharge status: Home / Self Care | Payer: Medicare Other | Attending: Emergency Medicine | Admitting: Emergency Medicine

## 2015-11-27 DIAGNOSIS — T18108A Unspecified foreign body in esophagus causing other injury, initial encounter: Secondary | ICD-10-CM | POA: Diagnosis not present

## 2015-11-27 DIAGNOSIS — Y939 Activity, unspecified: Secondary | ICD-10-CM | POA: Diagnosis not present

## 2015-11-27 DIAGNOSIS — Y929 Unspecified place or not applicable: Secondary | ICD-10-CM | POA: Diagnosis not present

## 2015-11-27 DIAGNOSIS — T189XXA Foreign body of alimentary tract, part unspecified, initial encounter: Secondary | ICD-10-CM | POA: Diagnosis present

## 2015-11-27 DIAGNOSIS — X58XXXA Exposure to other specified factors, initial encounter: Secondary | ICD-10-CM | POA: Diagnosis not present

## 2015-11-27 DIAGNOSIS — Z79899 Other long term (current) drug therapy: Secondary | ICD-10-CM | POA: Diagnosis not present

## 2015-11-27 DIAGNOSIS — Y999 Unspecified external cause status: Secondary | ICD-10-CM | POA: Insufficient documentation

## 2015-11-27 NOTE — Discharge Instructions (Signed)
Call the GI doctor on call and see if you can see them in the office. They may want to do an endoscopy or a swallow study on you.

## 2015-11-27 NOTE — ED Triage Notes (Signed)
Pt states he feels MVI pill is stuck in throat-took pill approx 25 min PTA-NAD-steady gait

## 2015-11-27 NOTE — ED Provider Notes (Signed)
[facility] Provider Note   CSN: [id number] Arrival date & time: 11/27/15  1244     History   Chief Complaint Chief Complaint  Patient presents with  . Swallowed Foreign Body    HPI Steven Warner is a 82 y.o. male.  82 yo M with a chief complaint of a esophageal foreign body. Patient states that he took his multivitamin today and felt like it got stuck. He has been able to eat and drink afterwards without difficulty. He is complaining mostly of a painful swallowing sensation. He denies any choking denies shortness of breath. Has never had this happen to him before. Had a GI doctor in the remote past but not one  here locally.   The history is provided by the patient.  Swallowed Foreign Body  This is a new problem. The current episode started less than 1 hour ago. The problem occurs constantly. The problem has not changed since onset.Pertinent negatives include no chest pain, no abdominal pain, no headaches and no shortness of breath. Nothing aggravates the symptoms. Nothing relieves the symptoms. He has tried nothing for the symptoms. The treatment provided no relief.    Past Medical History:  Diagnosis Date  . Barretts esophagus   . GERD (gastroesophageal reflux disease)   . Pyloric stenosis   . Thyroid disease     Patient Active Problem List   Diagnosis Date Noted  . Urticaria 06/01/2012    Past Surgical History:  Procedure Laterality Date  . APPENDECTOMY     Ruptured, opened  . Pyloric stenosis surgery     At age 733 months  . THYROID SURGERY         Home Medications    Prior to Admission medications   Medication Sig Start Date End Date Taking? Authorizing Provider  clopidogrel (PLAVIX) 75 MG tablet Take 75 mg by mouth daily.   Yes Historical Provider, MD  FINASTERIDE PO Take by mouth.    Historical Provider, MD  levothyroxine (SYNTHROID, LEVOTHROID) 100 MCG tablet Take 100 mcg by mouth daily.    Historical Provider, MD  sodium chloride (OCEAN)  0.65 % SOLN nasal spray Place 1 spray into both nostrils as needed for congestion. 06/18/14   Jennifer Piepenbrink, PA-C  timolol (TIMOPTIC-XR) 0.5 % ophthalmic gel-forming 1 drop daily.    Historical Provider, MD  zolpidem (AMBIEN CR) 12.5 MG CR tablet Take 12.5 mg by mouth at bedtime as needed.    Historical Provider, MD    Family History No family history on file.  Social History Social History  Substance Use Topics  . Smoking status: Never Smoker  . Smokeless tobacco: Never Used  . Alcohol use Yes     Comment: occ     Allergies   Shellfish allergy   Review of Systems Review of Systems  Constitutional: Negative for chills and fever.  HENT: Positive for trouble swallowing. Negative for congestion and facial swelling.   Eyes: Negative for discharge and visual disturbance.  Respiratory: Negative for shortness of breath.   Cardiovascular: Negative for chest pain and palpitations.  Gastrointestinal: Negative for abdominal pain, diarrhea and vomiting.  Musculoskeletal: Negative for arthralgias and myalgias.  Skin: Negative for color change and rash.  Neurological: Negative for tremors, syncope and headaches.  Psychiatric/Behavioral: Negative for confusion and dysphoric mood.     Physical Exam Updated Vital Signs BP 156/84 (BP Location: Left Arm)   Pulse 76   Temp 98.1 F (36.7 C) (Oral)   Resp 18   SpO2  97%   Physical Exam  Constitutional: He is oriented to person, place, and time. He appears well-developed and well-nourished.  HENT:  Head: Normocephalic and atraumatic.  Eyes: EOM are normal. Pupils are equal, round, and reactive to light.  Neck: Normal range of motion. Neck supple. No JVD present.  Cardiovascular: Normal rate and regular rhythm.  Exam reveals no gallop and no friction rub.   No murmur heard. Pulmonary/Chest: No respiratory distress. He has no wheezes.  Abdominal: He exhibits no distension and no mass. There is no tenderness. There is no rebound and  no guarding.  Musculoskeletal: Normal range of motion.  Neurological: He is alert and oriented to person, place, and time.  Skin: No rash noted. No pallor.  Psychiatric: He has a normal mood and affect. His behavior is normal.  Nursing note and vitals reviewed.    ED Treatments / Results  Labs (all labs ordered are listed, but only abnormal results are displayed) Labs Reviewed - No data to display  EKG  EKG Interpretation None       Radiology No results found.  Procedures Procedures (including critical care time)  Medications Ordered in ED Medications - No data to display   Initial Impression / Assessment and Plan / ED Course  I have reviewed the triage vital signs and the nursing notes.  Pertinent labs & imaging results that were available during my care of the patient were reviewed by me and considered in my medical decision making (see chart for details).  Clinical Course     82 yo M With a chief complaint of a esophageal foreign body. Patient is well-appearing and nontoxic he is having no difficulty with swallowing. Is able to eat and drink without difficulty. Given GI follow-up.  1:42 PM:  I have discussed the diagnosis/risks/treatment options with the patient and believe the pt to be eligible for discharge home to follow-up with PCP. We also discussed returning to the ED immediately if new or worsening sx occur. We discussed the sx which are most concerning (e.g., sudden worsening pain, fever, inability to tolerate by mouth) that necessitate immediate return. Medications administered to the patient during their visit and any new prescriptions provided to the patient are listed below.  Medications given during this visit Medications - No data to display   The patient appears reasonably screen and/or stabilized for discharge and I doubt any other medical condition or other [facility] requiring further screening, evaluation, or treatment in the ED at this time prior to  discharge.    Final Clinical Impressions(s) / ED Diagnoses   Final diagnoses:  Esophageal foreign body, initial encounter    New Prescriptions New Prescriptions   No medications on file     Melene PlanDan Riana Tessmer, DO 11/27/15 1343

## 2018-01-13 ENCOUNTER — Emergency Department (HOSPITAL_BASED_OUTPATIENT_CLINIC_OR_DEPARTMENT_OTHER)
Admission: EM | Admit: 2018-01-13 | Discharge: 2018-01-13 | Disposition: A | Discharge status: Home / Self Care | Payer: Medicare Other | Attending: Emergency Medicine | Admitting: Emergency Medicine

## 2018-01-13 ENCOUNTER — Other Ambulatory Visit: Payer: Self-pay

## 2018-01-13 ENCOUNTER — Encounter (HOSPITAL_BASED_OUTPATIENT_CLINIC_OR_DEPARTMENT_OTHER): Payer: Self-pay | Admitting: Emergency Medicine

## 2018-01-13 DIAGNOSIS — Z79899 Other long term (current) drug therapy: Secondary | ICD-10-CM | POA: Insufficient documentation

## 2018-01-13 DIAGNOSIS — R0982 Postnasal drip: Secondary | ICD-10-CM | POA: Insufficient documentation

## 2018-01-13 DIAGNOSIS — R0981 Nasal congestion: Secondary | ICD-10-CM | POA: Diagnosis present

## 2018-01-13 DIAGNOSIS — J019 Acute sinusitis, unspecified: Secondary | ICD-10-CM | POA: Insufficient documentation

## 2018-01-13 DIAGNOSIS — B9789 Other viral agents as the cause of diseases classified elsewhere: Secondary | ICD-10-CM

## 2018-01-13 NOTE — Discharge Instructions (Addendum)
I recommend you try an over the counter saline rinse for your symptoms of congestion and follow up with your primary care doctor.

## 2018-01-13 NOTE — ED Triage Notes (Signed)
Almost a month of "problems " with nose and ear. Running nose , eye drainage and feels like ears are congested

## 2018-01-13 NOTE — ED Provider Notes (Signed)
[facility] EMERGENCY DEPARTMENT Provider Note   CSN: [id number] Arrival date & time: 01/13/18  9833     History   Chief Complaint Chief Complaint  Patient presents with  . URI    HPI Steven Warner is a 85 y.o. male.  HPI   Over the last month has had congestion, facial congestion and pain. No cough. No fever. But having drainage from sinuses going down. Pressure facial. Congestion of nose, ear pressure.  Has not tried anything for it yet. Never had it before. [facility] at [facility].     Past Medical History:  Diagnosis Date  . Barretts esophagus   . GERD (gastroesophageal reflux disease)   . Pyloric stenosis   . Thyroid disease     Patient Active Problem List   Diagnosis Date Noted  . Urticaria 06/01/2012    Past Surgical History:  Procedure Laterality Date  . APPENDECTOMY     Ruptured, opened  . Pyloric stenosis surgery     At age 65 months  . THYROID SURGERY          Home Medications    Prior to Admission medications   Medication Sig Start Date End Date Taking? Authorizing Provider  clopidogrel (PLAVIX) 75 MG tablet Take 75 mg by mouth daily.   Yes [provider]  levothyroxine (SYNTHROID, LEVOTHROID) 100 MCG tablet Take 100 mcg by mouth daily.   Yes [provider]  timolol (TIMOPTIC-XR) 0.5 % ophthalmic gel-forming 1 drop daily.   Yes [provider]  zolpidem (AMBIEN CR) 12.5 MG CR tablet Take 12.5 mg by mouth at bedtime as needed.   Yes [provider]  FINASTERIDE PO Take by mouth.    [provider]  sodium chloride (OCEAN) 0.65 % SOLN nasal spray Place 1 spray into both nostrils as needed for congestion. 06/18/14   Piepenbrink, Victorino Dike, PA-C    Family History No family history on file.  Social History Social History   Tobacco Use  . Smoking status: Never Smoker  . Smokeless tobacco: Never Used  Substance Use Topics  . Alcohol use: Yes    Comment: occ  . Drug use: No      Allergies   Shellfish allergy   Review of Systems Review of Systems  Constitutional: Negative for fever.  HENT: Positive for congestion, postnasal drip, rhinorrhea and sinus pressure. Negative for sinus pain.   Eyes: Negative for visual disturbance.  Respiratory: Negative for shortness of breath.   Cardiovascular: Negative for chest pain.  Gastrointestinal: Negative for nausea and vomiting.  Neurological: Negative for headaches.     Physical Exam Updated Vital Signs BP (!) 154/86 (BP Location: Left Arm)   Pulse 72   Temp 97.9 F (36.6 C) (Oral)   Resp 14   Ht 5' 6.5" (1.689 m)   Wt 65.8 kg   SpO2 99%   BMI 23.05 kg/m   Physical Exam Vitals signs and nursing note reviewed.  Constitutional:      Appearance: He is well-developed.  HENT:     Head: Normocephalic and atraumatic.     Comments: No sinus tenderness Eyes:     Conjunctiva/sclera: Conjunctivae normal.  Neck:     Musculoskeletal: Neck supple.  Cardiovascular:     Rate and Rhythm: Normal rate and regular rhythm.     Heart sounds: No murmur.  Pulmonary:     Effort: Pulmonary effort is normal. No respiratory distress.     Breath sounds: Normal breath sounds.  Skin:  General: Skin is warm and dry.  Neurological:     Mental Status: He is alert.      ED Treatments / Results  Labs (all labs ordered are listed, but only abnormal results are displayed) Labs Reviewed - No data to display  EKG None  Radiology No results found.  Procedures Procedures (including critical care time)  Medications Ordered in ED Medications - No data to display   Initial Impression / Assessment and Plan / ED Course  I have reviewed the triage vital signs and the nursing notes.  Pertinent labs & imaging results that were available during my care of the patient were reviewed by me and considered in my medical decision making (see chart for details).     85yo male presents with concern for sinus congestion.  No  fevers, no green nasal mucus, no sinus tenderness, doubt bacterial sinusitis.  Normal eye exam, no sign of glaucoma, orbital cellulitis. Recommend continued outpatient follow up, may try saline rinse in addition to flonase. Suspect viral or allergic sinusitis. Patient discharged in stable condition with understanding of reasons to return.   Final Clinical Impressions(s) / ED Diagnoses   Final diagnoses:  Acute viral sinusitis    ED Discharge Orders    None       Alvira Monday, MD 01/15/18 1006

## 2018-10-10 ENCOUNTER — Other Ambulatory Visit: Payer: Self-pay

## 2018-10-10 ENCOUNTER — Emergency Department (HOSPITAL_BASED_OUTPATIENT_CLINIC_OR_DEPARTMENT_OTHER): Payer: Medicare Other

## 2018-10-10 ENCOUNTER — Emergency Department (HOSPITAL_BASED_OUTPATIENT_CLINIC_OR_DEPARTMENT_OTHER)
Admission: EM | Admit: 2018-10-10 | Discharge: 2018-10-10 | Disposition: A | Discharge status: Home / Self Care | Payer: Medicare Other | Attending: Emergency Medicine | Admitting: Emergency Medicine

## 2018-10-10 ENCOUNTER — Encounter (HOSPITAL_BASED_OUTPATIENT_CLINIC_OR_DEPARTMENT_OTHER): Payer: Self-pay | Admitting: *Deleted

## 2018-10-10 DIAGNOSIS — E039 Hypothyroidism, unspecified: Secondary | ICD-10-CM | POA: Insufficient documentation

## 2018-10-10 DIAGNOSIS — M545 Low back pain, unspecified: Secondary | ICD-10-CM

## 2018-10-10 DIAGNOSIS — M549 Dorsalgia, unspecified: Secondary | ICD-10-CM | POA: Diagnosis present

## 2018-10-10 NOTE — ED Provider Notes (Signed)
Emergency Department Provider Note   I have reviewed the triage vital signs and the nursing notes.   HISTORY  Chief Complaint Back Pain   HPI Steven Warner is a 85 y.o. male with past medical history reviewed below presents to the emergency department for evaluation of a "bump" in his left lower back just above the buttock.  He has felt it for the past several months and feels as if it may be getting larger.  He states at times he is sitting and feels some mild discomfort in this area.  He has not appreciated any overlying redness, drainage, fever.  No injury.  Denies numbness or tingling in his lower extremities.  No abdominal pain. No radiation of symptoms or other modifying factors.   Past Medical History:  Diagnosis Date  . Barretts esophagus   . GERD (gastroesophageal reflux disease)   . Pyloric stenosis   . Thyroid disease     Patient Active Problem List   Diagnosis Date Noted  . Urticaria 06/01/2012    Past Surgical History:  Procedure Laterality Date  . APPENDECTOMY     Ruptured, opened  . Pyloric stenosis surgery     At age 86 months  . THYROID SURGERY      Allergies Shellfish allergy  No family history on file.  Social History Social History   Tobacco Use  . Smoking status: Never Smoker  . Smokeless tobacco: Never Used  Substance Use Topics  . Alcohol use: Yes    Comment: occ  . Drug use: No    Review of Systems  Constitutional: No fever/chills Eyes: No visual changes. ENT: No sore throat. Cardiovascular: Denies chest pain. Respiratory: Denies shortness of breath. Gastrointestinal: No abdominal pain.  No nausea, no vomiting.  No diarrhea.  No constipation. Genitourinary: Negative for dysuria. Musculoskeletal: Positive feeling of a mass above the left buttock with mild discomfort.  Skin: Negative for rash. Neurological: Negative for headaches, focal weakness or numbness.  10-point ROS otherwise negative.   ____________________________________________   PHYSICAL EXAM:  VITAL SIGNS: BP: 146/78 RR: 18 SpO2: 98% RA Pulse: 68  Constitutional: Alert and oriented. Well appearing and in no acute distress. Eyes: Conjunctivae are normal. Head: Atraumatic. Nose: No congestion/rhinnorhea. Mouth/Throat: Mucous membranes are moist.   Neck: No stridor.  Cardiovascular: Normal rate, regular rhythm.  Respiratory: Normal respiratory effort.  Gastrointestinal: No distention.  Musculoskeletal: No lower extremity tenderness nor edema. No gross deformities of extremities.  No tenderness to palpation over the thoracic, lumbar, sacrum.  No palpable masses in the left sacral/buttock region.  No evidence of abscess.  No areas of tenderness.  Neurologic:  Normal speech and language.  Skin:  Skin is warm, dry and intact. No rash noted.  ____________________________________________  RADIOLOGY  Dg Sacrum/coccyx  Result Date: 10/10/2018 CLINICAL DATA:  Tenderness. EXAM: SACRUM AND COCCYX - 2+ VIEW COMPARISON:  Jun 11, 2015. FINDINGS: There is no acute displaced fracture. There are degenerative changes of the partially visualized lower lumbar spine, greatest at the L5-S1 level. IMPRESSION: No acute osseous abnormality. Electronically Signed   By: Katherine Mantle M.D.   On: 10/10/2018 11:07    ____________________________________________   PROCEDURES  Procedure(s) performed:   Procedures  ULTRASOUND LIMITED SOFT TISSUE/ MUSCULOSKELETAL:  Indication: Suspected left sacral mass Linear probe used to evaluate area of interest in two planes. Findings:  No area of fluid collection. Performed by: Dr Jacqulyn Bath  ____________________________________________   INITIAL IMPRESSION / ASSESSMENT AND PLAN / ED COURSE  Pertinent  labs & imaging results that were available during my care of the patient were reviewed by me and considered in my medical decision making (see chart for details).   Patient presents to the  emergency department for evaluation of possible mass/swelling over the left buttock/sacrum area.  There is no overlying erythema or concern for cellulitis.  I do not appreciate a palpable mass.  There is no bony tenderness.  Plain film reviewed with no acute osseous findings.  I performed a bedside ultrasound which did not demonstrate any area of fluid collection.  Advised the patient apply warm compress, take Tylenol as needed for pain, follow with the primary care physician should discontinue for further outpatient evaluation and treatment.  ____________________________________________  FINAL CLINICAL IMPRESSION(S) / ED DIAGNOSES  Final diagnoses:  Acute left-sided low back pain without sciatica    Note:  This document was prepared using Dragon voice recognition software and may include unintentional dictation errors.  Nanda Quinton, MD, [facility]    Sarah-Jane Nazario, [facility], MD 10/11/18 [phone]

## 2018-10-10 NOTE — Discharge Instructions (Signed)
You were seen in the emergency department with mild pain in your lower back.  I was unable to find any collection of fluid, cyst, or other abnormality.  Your x-ray is also unremarkable.  Please follow with your primary care physician who can continue to follow the symptoms.  You may take Tylenol as needed for mild to moderate pain.  Return to the emergency department any new or suddenly worsening symptoms.

## 2018-10-10 NOTE — ED Triage Notes (Signed)
Pt reports feeling as if there is a bump on his left buttock and that it is getting larger for several months. This rn inspects and palpates, no mass appreciated or observed with palpation. Pt is non tender, denies pain "I can just feel like it's there." denies fevers or any other c/o.

## 2018-10-10 NOTE — ED Notes (Addendum)
Left lower back pain  Pt states he feels a knot  X several months  Pain increased w pressure

## 2020-01-24 IMAGING — DX DG SACRUM/COCCYX 2+V
3 series · 3 of 3 positions shown · non-contrast
Comparison: June 11, 2015.

CLINICAL DATA: Tenderness.

EXAM:
SACRUM AND COCCYX - 2+ VIEW

[coccyx ap]
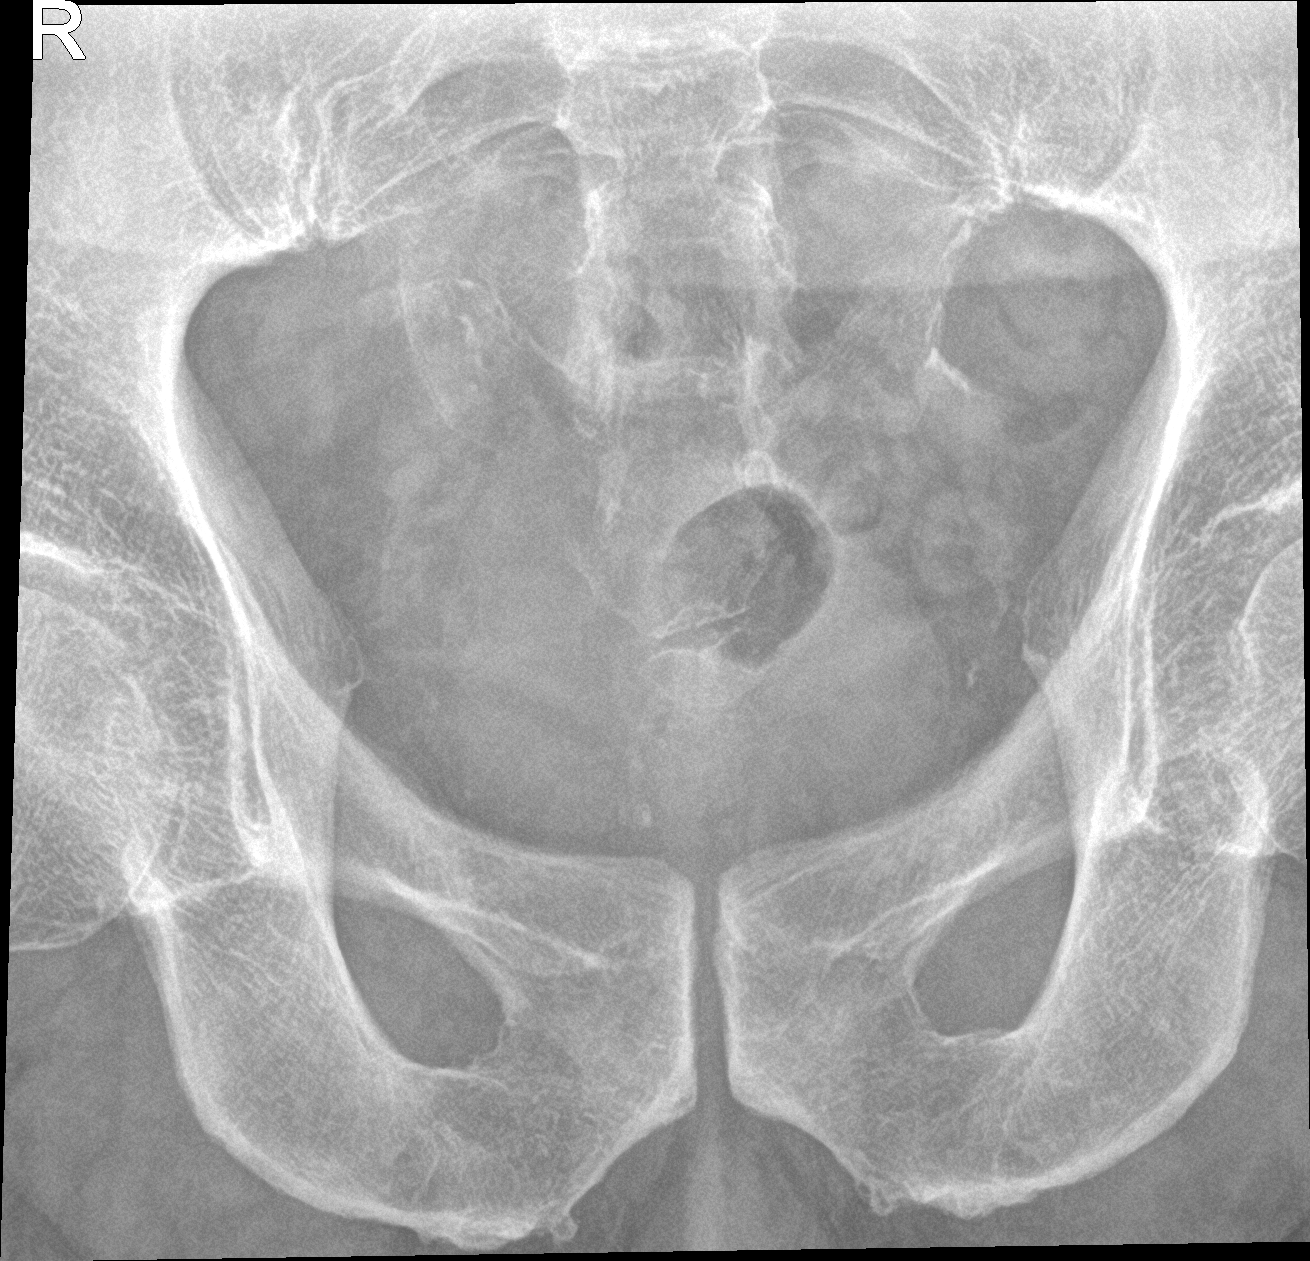

[sacrum ap]
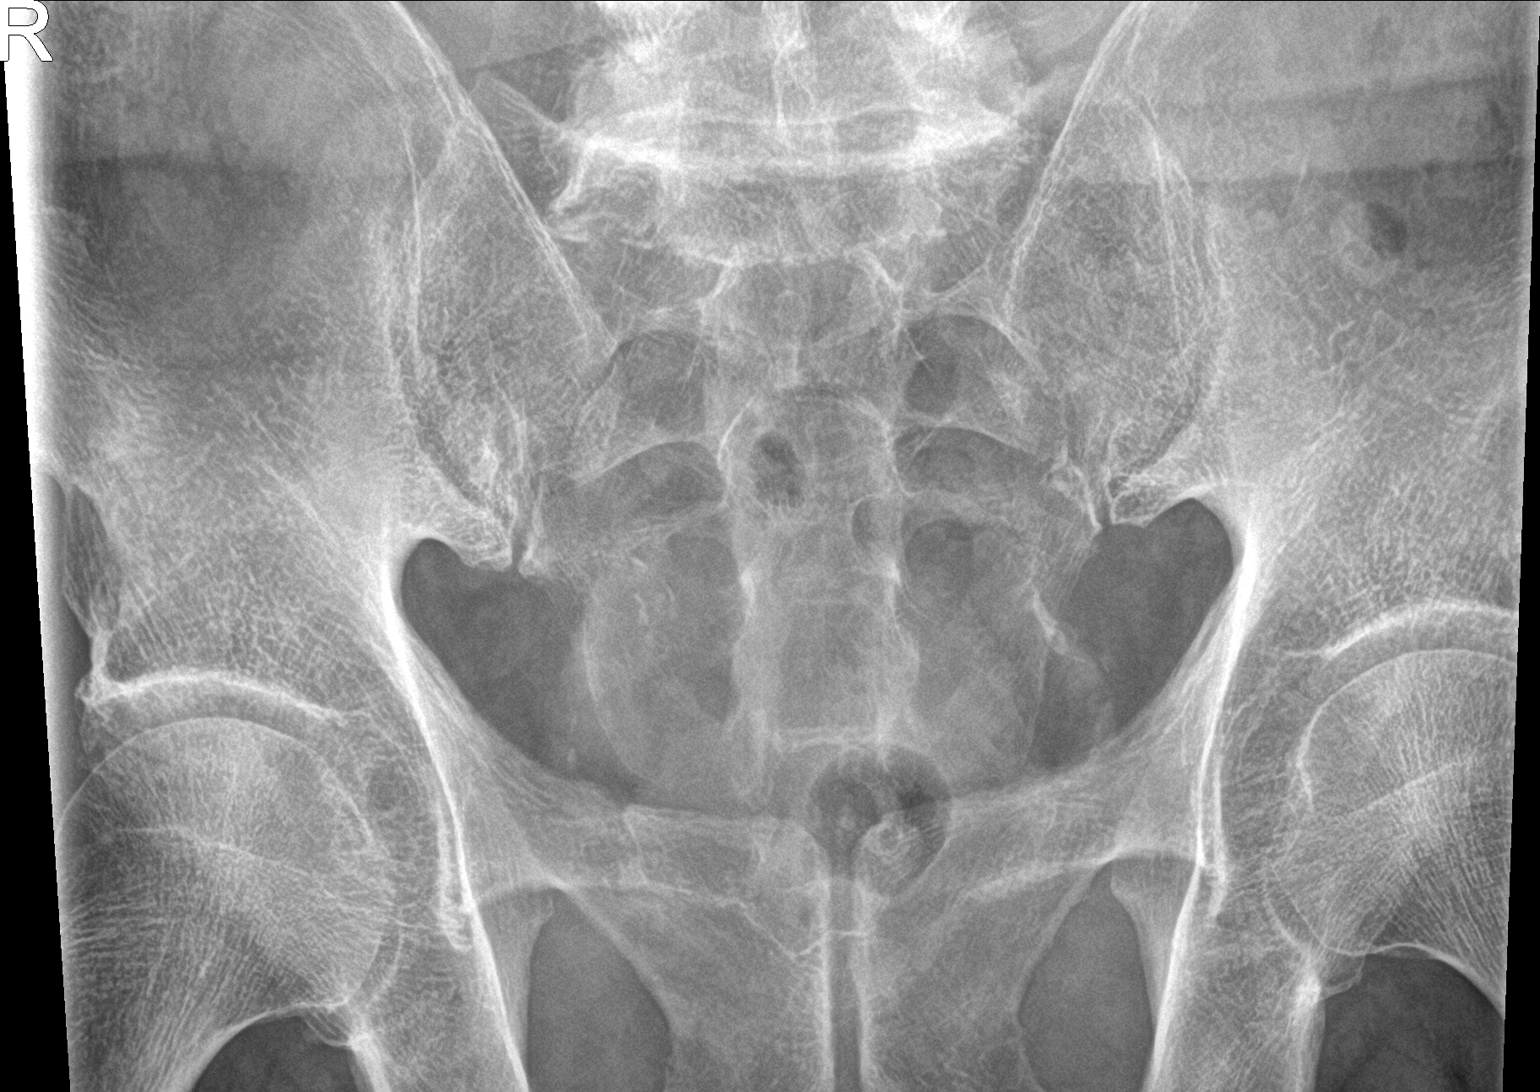

[sacrum lat]
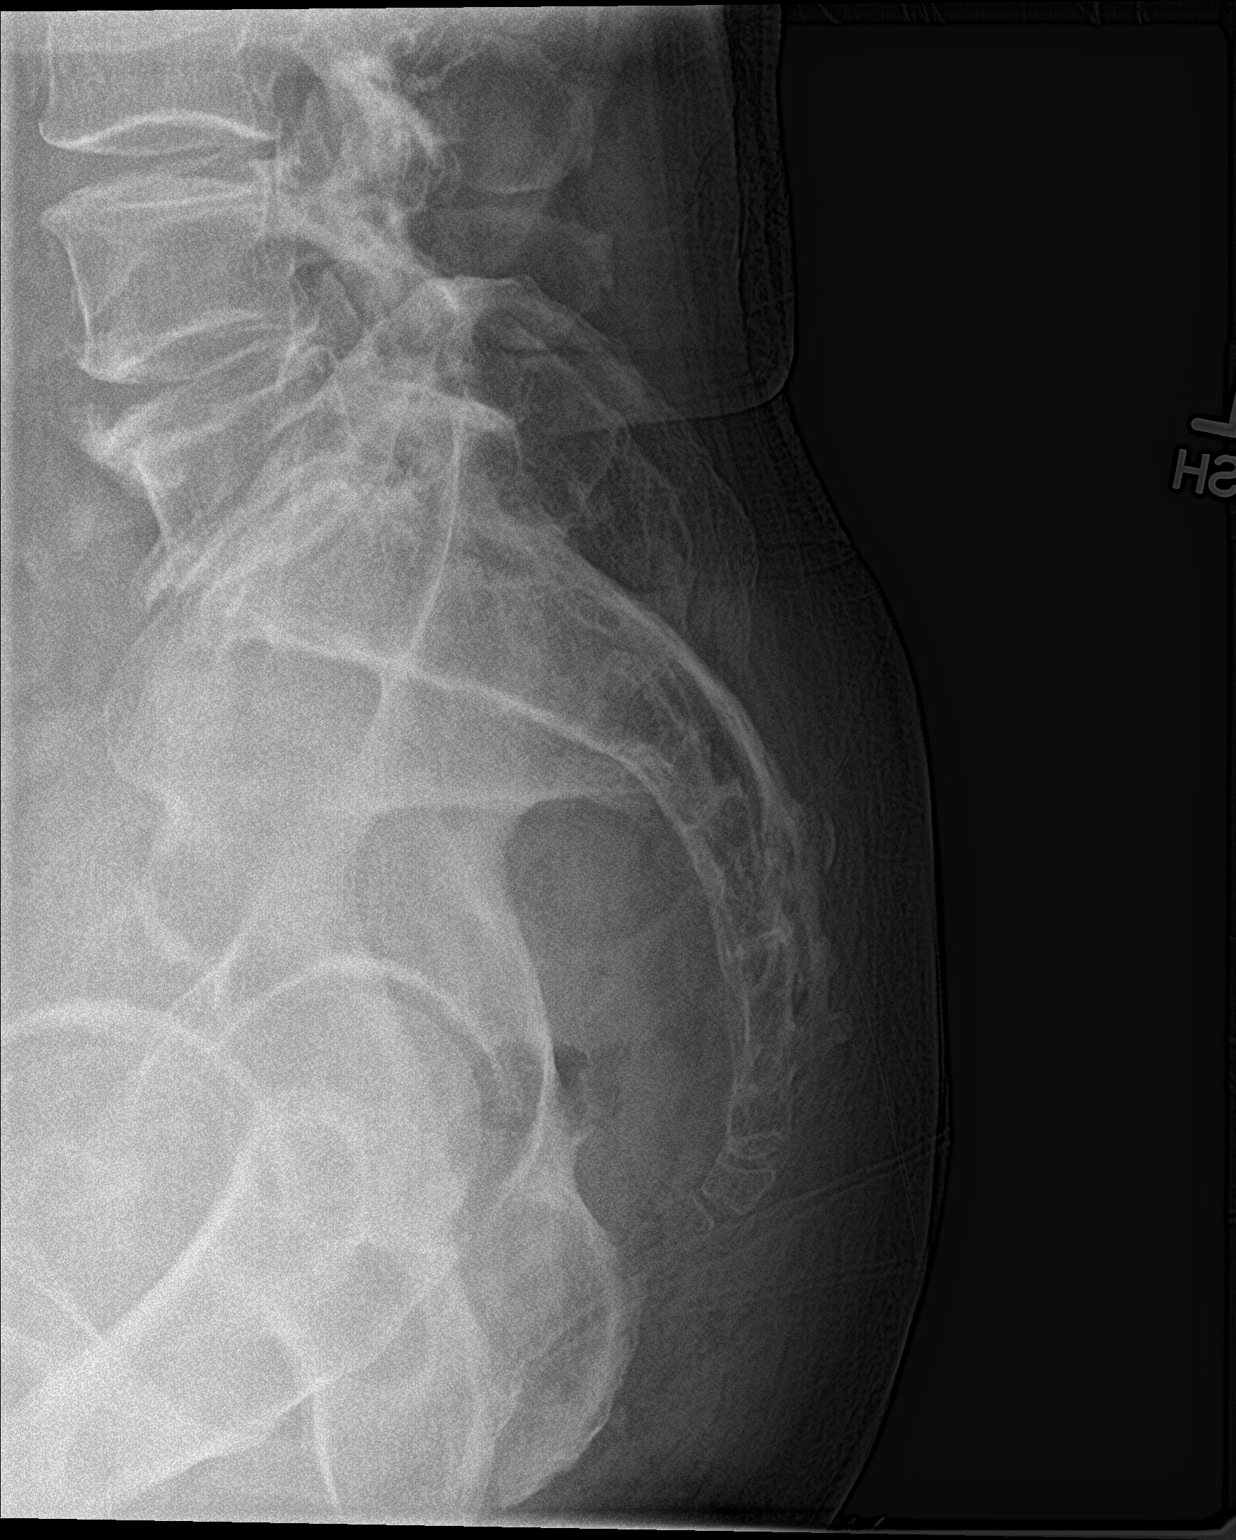

[3 of 3 positions shown; findings below may reference images not displayed]

FINDINGS: There is no acute displaced fracture. There are degenerative changes
of the partially visualized lower lumbar spine, greatest at the
L5-S1 level.
IMPRESSION: No acute osseous abnormality.

## 2023-04-17 ENCOUNTER — Emergency Department (HOSPITAL_BASED_OUTPATIENT_CLINIC_OR_DEPARTMENT_OTHER)

## 2023-04-17 ENCOUNTER — Encounter (HOSPITAL_BASED_OUTPATIENT_CLINIC_OR_DEPARTMENT_OTHER): Payer: Self-pay

## 2023-04-17 ENCOUNTER — Emergency Department (HOSPITAL_BASED_OUTPATIENT_CLINIC_OR_DEPARTMENT_OTHER)
Admission: EM | Admit: 2023-04-17 | Discharge: 2023-04-17 | Disposition: A | Discharge status: Other Acute Inpatient Hospital | Attending: Emergency Medicine | Admitting: Emergency Medicine

## 2023-04-17 DIAGNOSIS — S0990XA Unspecified injury of head, initial encounter: Secondary | ICD-10-CM | POA: Insufficient documentation

## 2023-04-17 DIAGNOSIS — Z7902 Long term (current) use of antithrombotics/antiplatelets: Secondary | ICD-10-CM | POA: Insufficient documentation

## 2023-04-17 DIAGNOSIS — W19XXXA Unspecified fall, initial encounter: Secondary | ICD-10-CM | POA: Insufficient documentation

## 2023-04-17 DIAGNOSIS — S50311A Abrasion of right elbow, initial encounter: Secondary | ICD-10-CM | POA: Insufficient documentation

## 2023-04-17 DIAGNOSIS — Z23 Encounter for immunization: Secondary | ICD-10-CM | POA: Insufficient documentation

## 2023-04-17 DIAGNOSIS — S59901A Unspecified injury of right elbow, initial encounter: Secondary | ICD-10-CM | POA: Diagnosis present

## 2023-04-17 LAB — COMPREHENSIVE METABOLIC PANEL WITH GFR
ALT: 19 U/L (ref 0–44)
AST: 26 U/L (ref 15–41)
Albumin: 3.4 g/dL — ABNORMAL LOW (ref 3.5–5.0)
Alkaline Phosphatase: 39 U/L (ref 38–126)
Anion gap: 8 (ref 5–15)
BUN: 22 mg/dL (ref 8–23)
CO2: 25 mmol/L (ref 22–32)
Calcium: 8.5 mg/dL — ABNORMAL LOW (ref 8.9–10.3)
Chloride: 106 mmol/L (ref 98–111)
Creatinine, Ser: 1.14 mg/dL (ref 0.61–1.24)
GFR, Estimated: 60 mL/min — ABNORMAL LOW (ref >60)
Glucose, Bld: 93 mg/dL (ref 70–99)
Potassium: 3.8 mmol/L (ref 3.5–5.1)
Sodium: 139 mmol/L (ref 135–145)
Total Bilirubin: 1.1 mg/dL (ref 0.0–1.2)
Total Protein: 6.4 g/dL — ABNORMAL LOW (ref 6.5–8.1)

## 2023-04-17 LAB — CBC WITH DIFFERENTIAL/PLATELET
Abs Immature Granulocytes: 0.03 10*3/uL (ref 0.00–0.07)
Basophils Absolute: 0 10*3/uL (ref 0.0–0.1)
Basophils Relative: 1 %
Eosinophils Absolute: 0 10*3/uL (ref 0.0–0.5)
Eosinophils Relative: 1 %
HCT: 34.7 % — ABNORMAL LOW (ref 39.0–52.0)
Hemoglobin: 11.7 g/dL — ABNORMAL LOW (ref 13.0–17.0)
Immature Granulocytes: 0 %
Lymphocytes Relative: 22 %
Lymphs Abs: 1.8 10*3/uL (ref 0.7–4.0)
MCH: 31.4 pg (ref 26.0–34.0)
MCHC: 33.7 g/dL (ref 30.0–36.0)
MCV: 93 fL (ref 80.0–100.0)
Monocytes Absolute: 1.2 10*3/uL — ABNORMAL HIGH (ref 0.1–1.0)
Monocytes Relative: 15 %
Neutro Abs: 5.1 10*3/uL (ref 1.7–7.7)
Neutrophils Relative %: 61 %
Platelets: 260 10*3/uL (ref 150–400)
RBC: 3.73 MIL/uL — ABNORMAL LOW (ref 4.22–5.81)
RDW: 13.2 % (ref 11.5–15.5)
WBC: 8.2 10*3/uL (ref 4.0–10.5)
nRBC: 0 % (ref 0.0–0.2)

## 2023-04-17 LAB — URINALYSIS, ROUTINE W REFLEX MICROSCOPIC
Bilirubin Urine: NEGATIVE
Glucose, UA: NEGATIVE mg/dL
Ketones, ur: NEGATIVE mg/dL
Leukocytes,Ua: NEGATIVE
Nitrite: NEGATIVE
Protein, ur: 30 mg/dL — AB
Specific Gravity, Urine: 1.025 (ref 1.005–1.030)
pH: 6 (ref 5.0–8.0)

## 2023-04-17 LAB — TROPONIN I (HIGH SENSITIVITY): Troponin I (High Sensitivity): 4 ng/L (ref <18)

## 2023-04-17 LAB — URINALYSIS, MICROSCOPIC (REFLEX): Squamous Epithelial / HPF: NONE SEEN /HPF (ref 0–5)

## 2023-04-17 MED ORDER — SODIUM CHLORIDE 0.9 % IV BOLUS
500.0000 mL | Freq: Once | INTRAVENOUS | Status: AC
  Administered 2023-04-17: 500 mL via INTRAVENOUS

## 2023-04-17 MED ORDER — TETANUS-DIPHTH-ACELL PERTUSSIS 5-2.5-18.5 LF-MCG/0.5 IM SUSY
0.5000 mL | PREFILLED_SYRINGE | Freq: Once | INTRAMUSCULAR | Status: AC
  Administered 2023-04-17: 0.5 mL via INTRAMUSCULAR
  Filled 2023-04-17: qty 0.5

## 2023-04-17 NOTE — ED Notes (Signed)
 Called [facility]. Spoke with French Ana, LPN and is aware of findings

## 2023-04-17 NOTE — ED Notes (Signed)
 Dressing intact to right elbow.

## 2023-04-17 NOTE — ED Notes (Signed)
 ED Provider at bedside.

## 2023-04-17 NOTE — ED Provider Notes (Signed)
 [place] [facility] AT [facility] Provider Note   CSN: [id number] Arrival date & time: 04/17/23  0815     History  Chief Complaint  Patient presents with   Steven Warner is a 89 y.o. male.  HPI 89 year old male presents with a fall.  2 nights ago he fell while coming back from the bathroom.  He states he is not sure why he fell but he fell backwards.  He thinks he hit his head and also injured his right elbow.  He presents today because his elbow is still hurting.  He is also been feeling a little off and has a hard time describing what exactly he is feeling but he is walking slower.  He denies a headache, chest pain, palpitations, shortness of breath, syncope, lightheadedness or focal weakness.  His elbow hurts but otherwise he denies any other injury.  No urinary symptoms.  States that he received a COVID-vaccine the day before the symptoms occurred.  Home Medications Prior to Admission medications   Medication Sig Start Date End Date Taking? Authorizing Provider  clopidogrel (PLAVIX) 75 MG tablet Take 75 mg by mouth daily.    [provider]  FINASTERIDE PO Take by mouth.    [provider]  levothyroxine (SYNTHROID, LEVOTHROID) 100 MCG tablet Take 100 mcg by mouth daily.    [provider]  sodium chloride (OCEAN) 0.65 % SOLN nasal spray Place 1 spray into both nostrils as needed for congestion. 06/18/14   Piepenbrink, Victorino Dike, PA-C  timolol (TIMOPTIC-XR) 0.5 % ophthalmic gel-forming 1 drop daily.    [provider]  zolpidem (AMBIEN CR) 12.5 MG CR tablet Take 12.5 mg by mouth at bedtime as needed.    [provider]      Allergies    Esomeprazole and Shellfish allergy    Review of Systems   Review of Systems  Constitutional:  Negative for fever.  Respiratory:  Negative for cough and shortness of breath.   Cardiovascular:  Negative for chest pain and palpitations.  Gastrointestinal:  Negative for  diarrhea and vomiting.  Musculoskeletal:  Positive for arthralgias. Negative for neck pain.  Skin:  Positive for wound.  Neurological:  Negative for syncope, light-headedness, numbness and headaches.    Physical Exam Updated Vital Signs BP 137/70   Pulse (!) 57   Temp 97.8 F (36.6 C) (Oral)   Resp 16   SpO2 97%  Physical Exam Vitals and nursing note reviewed.  Constitutional:      General: He is not in acute distress.    Appearance: He is well-developed. He is not ill-appearing or diaphoretic.  HENT:     Head: Normocephalic and atraumatic.  Eyes:     Extraocular Movements: Extraocular movements intact.     Pupils: Pupils are equal, round, and reactive to light.  Cardiovascular:     Rate and Rhythm: Normal rate and regular rhythm.     Pulses:          Radial pulses are 2+ on the right side.     Heart sounds: Normal heart sounds.  Pulmonary:     Effort: Pulmonary effort is normal.     Breath sounds: Normal breath sounds.  Abdominal:     Palpations: Abdomen is soft.     Tenderness: There is no abdominal tenderness.  Musculoskeletal:     Right upper arm: No tenderness.     Right elbow: Swelling present. Normal range of motion. Tenderness present.  Right forearm: No tenderness.     Comments: Small abrasion  Skin:    General: Skin is warm and dry.  Neurological:     Mental Status: He is alert.     Comments: CN 3-12 grossly intact. 5/5 strength in all 4 extremities. Grossly normal sensation. Normal finger to nose.      ED Results / Procedures / Treatments   Labs (all labs ordered are listed, but only abnormal results are displayed) Labs Reviewed  COMPREHENSIVE METABOLIC PANEL WITH GFR - Abnormal; Notable for the following components:      Result Value   Calcium 8.5 (*)    Total Protein 6.4 (*)    Albumin 3.4 (*)    GFR, Estimated 60 (*)    All other components within normal limits  CBC WITH DIFFERENTIAL/PLATELET - Abnormal; Notable for the following components:    RBC 3.73 (*)    Hemoglobin 11.7 (*)    HCT 34.7 (*)    Monocytes Absolute 1.2 (*)    All other components within normal limits  URINALYSIS, ROUTINE W REFLEX MICROSCOPIC - Abnormal; Notable for the following components:   Hgb urine dipstick SMALL (*)    Protein, ur 30 (*)    All other components within normal limits  URINALYSIS, MICROSCOPIC (REFLEX) - Abnormal; Notable for the following components:   Bacteria, UA RARE (*)    All other components within normal limits  TROPONIN I (HIGH SENSITIVITY)    EKG EKG Interpretation Date/Time:  Saturday April 17 2023 08:39:01 EDT Ventricular Rate:  60 PR Interval:  223 QRS Duration:  144 QT Interval:  463 QTC Calculation: 463 R Axis:   82  Text Interpretation: Sinus rhythm Prolonged PR interval Right bundle branch block No old tracing to compare Confirmed by Pricilla Loveless [phone]) on 04/17/2023 9:30:38 AM  Radiology CT Head Wo Contrast Result Date: 04/17/2023 CLINICAL DATA:  89 year old male with recurrent falls.  Pain. EXAM: CT HEAD WITHOUT CONTRAST TECHNIQUE: Contiguous axial images were obtained from the base of the skull through the vertex without intravenous contrast. RADIATION DOSE REDUCTION: This exam was performed according to the departmental dose-optimization program which includes automated exposure control, adjustment of the mA and/or kV according to patient size and/or use of iterative reconstruction technique. COMPARISON:  None Available. FINDINGS: Brain: Disproportionate anterior and mesial temporal lobe atrophy (coronal image 45). No midline shift, ventriculomegaly, mass effect, evidence of mass lesion, intracranial hemorrhage or evidence of cortically based acute infarction. Gray-white differentiation within normal limits for age. Vascular: Calcified atherosclerosis at the skull base. No suspicious intracranial vascular hyperdensity. Skull: Intact.  No acute osseous abnormality identified. Sinuses/Orbits: Visualized paranasal  sinuses and mastoids are clear. Other: No acute orbit or scalp soft tissue injury identified. Oval and circumscribed left posterior convexity 1.5 cm probable sebaceous cyst series 301, image 40. IMPRESSION: 1. No acute intracranial abnormality or acute traumatic injury identified. 2.  Cerebral Atrophy (ICD10-G31.9). Electronically Signed   By: Odessa Fleming M.D.   On: 04/17/2023 09:40   DG Elbow Complete Right Result Date: 04/17/2023 CLINICAL DATA:  Right elbow pain after a fall. EXAM: RIGHT ELBOW - COMPLETE 3+ VIEW COMPARISON:  None Available. FINDINGS: No evidence for an acute fracture. No subluxation or dislocation. No worrisome lytic or sclerotic osseous abnormality. Mild hypertrophic spurring noted along the radial head. No fat pad elevation to suggest joint effusion. IMPRESSION: 1. No acute bony findings.  No joint effusion. 2. Mild hypertrophic spurring along the radial head. Electronically Signed   By:  Kennith Center M.D.   On: 04/17/2023 09:09    Procedures Procedures    Medications Ordered in ED Medications  sodium chloride 0.9 % bolus 500 mL (0 mLs Intravenous Stopped 04/17/23 1045)  Tdap (BOOSTRIX) injection 0.5 mL (0.5 mLs Intramuscular Given 04/17/23 1216)    ED Course/ Medical Decision Making/ A&P                                 Medical Decision Making Amount and/or Complexity of Data Reviewed Labs: ordered.    Details: Normal sodium. Radiology: ordered and independent interpretation performed.    Details: No head bleed or elbow fracture ECG/medicine tests: ordered and independent interpretation performed.    Details: No ischemia  Risk Prescription drug management.   Patient presents with continued arm pain after a fall.  He does have an abrasion there which was cleaned and dressed in the emergency department.  No signs of a fracture.  Neurovascular intact.  He was able to ambulate here and appeared at baseline.  Doubt stroke as the cause of his fall and he did not seem to have  syncope.  Workup here is unremarkable and he feels well enough for discharge.  Tdap will be updated.  Otherwise, discharged in the care of his sister and will discharge home with return precautions.        Final Clinical Impression(s) / ED Diagnoses Final diagnoses:  Abrasion of right elbow, initial encounter    Rx / DC Orders ED Discharge Orders     None         Pricilla Loveless, MD 04/17/23 1454

## 2023-04-17 NOTE — Discharge Instructions (Addendum)
 Please change the dressing on your right elbow once a day.  Otherwise keep the area clean with soap and water.

## 2023-04-17 NOTE — ED Notes (Addendum)
 Pt ambulated approx 1560ft in hallway unassisted, in NAD. Denys any abnormalities. He walks without any assistive devices normally.

## 2023-04-17 NOTE — ED Triage Notes (Addendum)
 Pt brought in by family member from [facility]. States that he had a fall on Thursday.. States that he has pain in right elbow area. Denies blacking out.

## 2023-04-17 NOTE — ED Notes (Signed)
Patient transported to CT and XR
